# Patient Record
Sex: Male | Born: 1955 | Race: Black or African American | Hispanic: No | Smoking: Never smoker
Health system: Southern US, Community
[De-identification: ages and names within clinical notes are randomized; demographics above are authoritative.]

## PROBLEM LIST (undated history)

## (undated) DIAGNOSIS — I441 Atrioventricular block, second degree: Secondary | ICD-10-CM

## (undated) DIAGNOSIS — R9431 Abnormal electrocardiogram [ECG] [EKG]: Secondary | ICD-10-CM

## (undated) DIAGNOSIS — I351 Nonrheumatic aortic (valve) insufficiency: Secondary | ICD-10-CM

## (undated) DIAGNOSIS — R0609 Other forms of dyspnea: Secondary | ICD-10-CM

## (undated) DIAGNOSIS — M109 Gout, unspecified: Secondary | ICD-10-CM

## (undated) DIAGNOSIS — E785 Hyperlipidemia, unspecified: Secondary | ICD-10-CM

## (undated) DIAGNOSIS — I2510 Atherosclerotic heart disease of native coronary artery without angina pectoris: Secondary | ICD-10-CM

## (undated) DIAGNOSIS — I10 Essential (primary) hypertension: Secondary | ICD-10-CM

## (undated) DIAGNOSIS — R0600 Dyspnea, unspecified: Secondary | ICD-10-CM

## (undated) HISTORY — DX: Abnormal electrocardiogram (ECG) (EKG): R94.31

## (undated) HISTORY — DX: Nonrheumatic aortic (valve) insufficiency: I35.1

## (undated) HISTORY — DX: Atherosclerotic heart disease of native coronary artery without angina pectoris: I25.10

## (undated) HISTORY — DX: Hyperlipidemia, unspecified: E78.5

## (undated) HISTORY — DX: Dyspnea, unspecified: R06.00

## (undated) HISTORY — DX: Other forms of dyspnea: R06.09

## (undated) HISTORY — DX: Atrioventricular block, second degree: I44.1

## (undated) HISTORY — PX: TUMOR REMOVAL: SHX12

---

## 2009-02-21 DIAGNOSIS — H409 Unspecified glaucoma: Secondary | ICD-10-CM | POA: Insufficient documentation

## 2009-05-22 DIAGNOSIS — M109 Gout, unspecified: Secondary | ICD-10-CM | POA: Insufficient documentation

## 2009-06-06 ENCOUNTER — Ambulatory Visit: Payer: Self-pay | Admitting: Gastroenterology

## 2009-06-20 ENCOUNTER — Ambulatory Visit: Payer: Self-pay | Admitting: Gastroenterology

## 2009-06-22 ENCOUNTER — Encounter: Payer: Self-pay | Admitting: Gastroenterology

## 2009-06-28 DIAGNOSIS — I10 Essential (primary) hypertension: Secondary | ICD-10-CM | POA: Insufficient documentation

## 2009-06-28 DIAGNOSIS — E785 Hyperlipidemia, unspecified: Secondary | ICD-10-CM | POA: Insufficient documentation

## 2009-06-28 DIAGNOSIS — E119 Type 2 diabetes mellitus without complications: Secondary | ICD-10-CM | POA: Insufficient documentation

## 2011-02-02 LAB — GLUCOSE, CAPILLARY: Glucose-Capillary: 104 mg/dL — ABNORMAL HIGH (ref 70–99)

## 2011-02-21 ENCOUNTER — Other Ambulatory Visit: Payer: Self-pay | Admitting: Otolaryngology

## 2011-02-21 DIAGNOSIS — J3489 Other specified disorders of nose and nasal sinuses: Secondary | ICD-10-CM

## 2011-02-22 ENCOUNTER — Other Ambulatory Visit: Payer: Self-pay | Admitting: Otolaryngology

## 2011-02-22 ENCOUNTER — Ambulatory Visit
Admission: RE | Admit: 2011-02-22 | Discharge: 2011-02-22 | Disposition: A | Discharge status: Home / Self Care | Payer: PRIVATE HEALTH INSURANCE | Source: Ambulatory Visit | Attending: Otolaryngology | Admitting: Otolaryngology

## 2011-02-22 DIAGNOSIS — J3489 Other specified disorders of nose and nasal sinuses: Secondary | ICD-10-CM

## 2012-05-06 IMAGING — CT CT MAXILLOFACIAL W/O CM
3 series · 15 of 47 positions shown, 18 images · non-contrast
Comparison: None.

CLINICAL DATA: 54-year-old male with left nasal mass, polyp,
headache, preoperative study.

CT MAXILLOFACIAL WITHOUT CONTRAST
TECHNIQUE: Multidetector CT imaging of the maxillofacial
structures was performed. Multiplanar CT image reconstructions were
also generated.

[Series 2: fusion/stealth · axial · 0.45mm/px · z∈[-42,+129]mm · 9 of 159 slices shown, 12 images]
[im 11/159  brain]
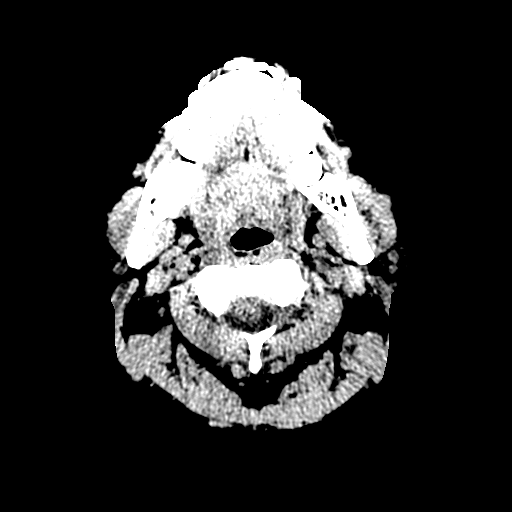
[im 11/159  bone]
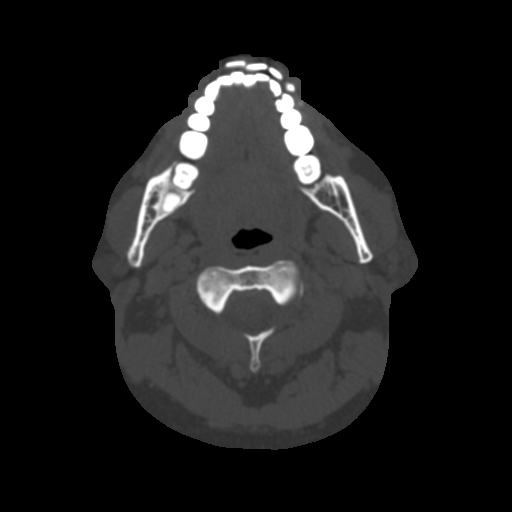
[im 28/159  bone]
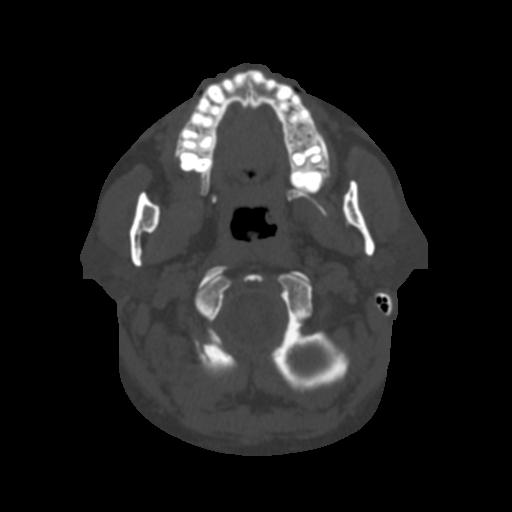
[im 44/159  bone]
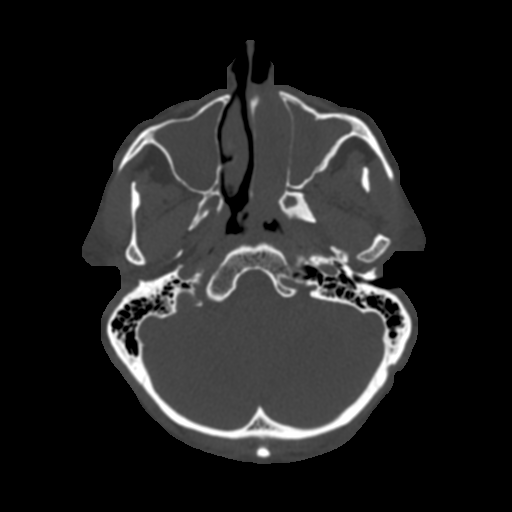
[im 60/159  bone]
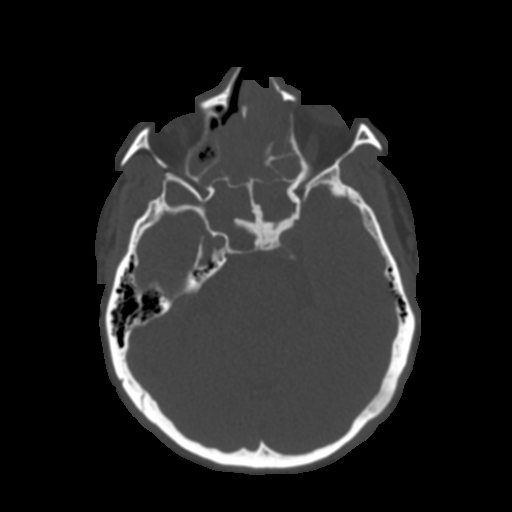
[im 82/159  brain]
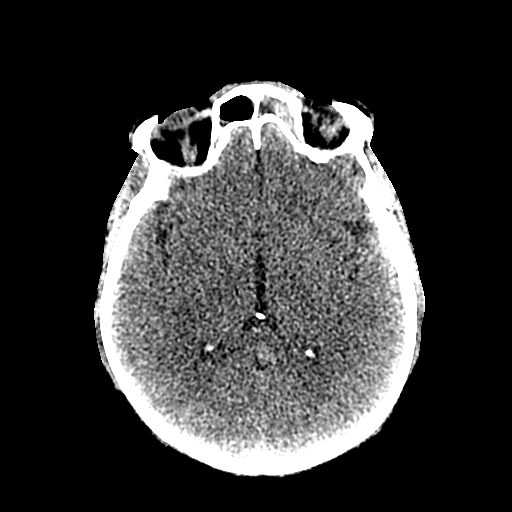
[im 82/159  bone]
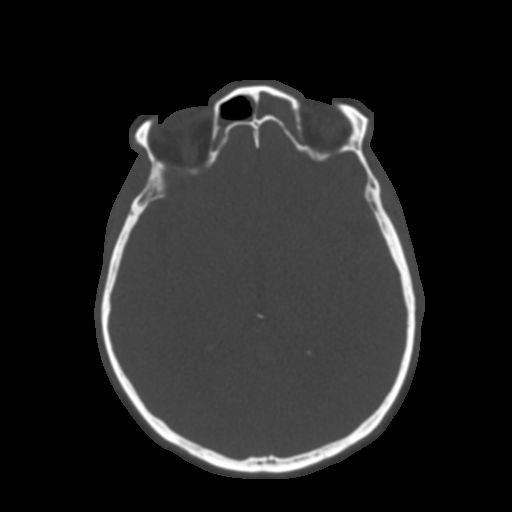
[im 99/159  bone]
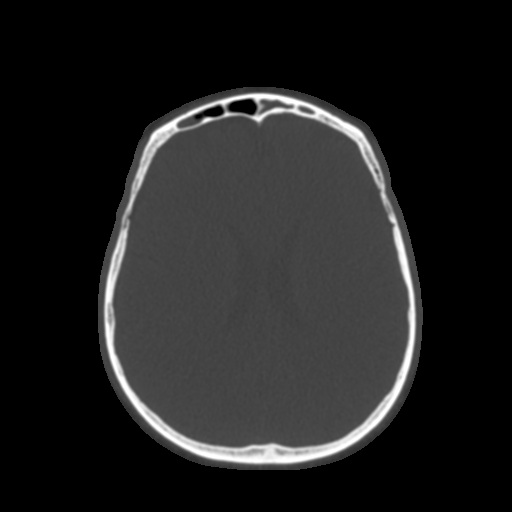
[im 115/159  bone]
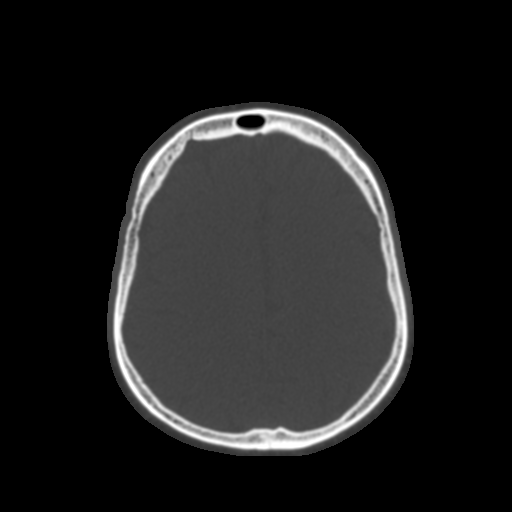
[im 131/159  bone]
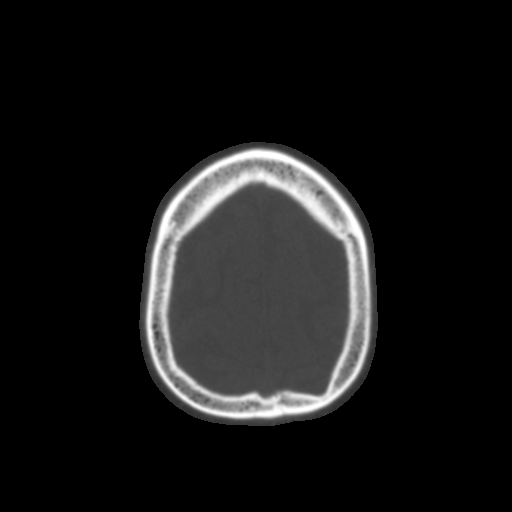
[im 148/159  brain]
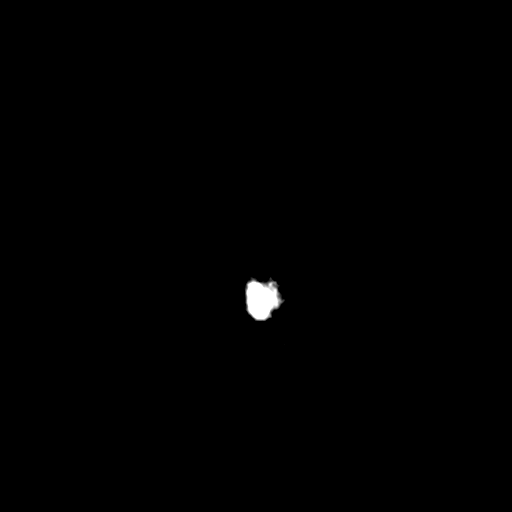
[im 148/159  bone]
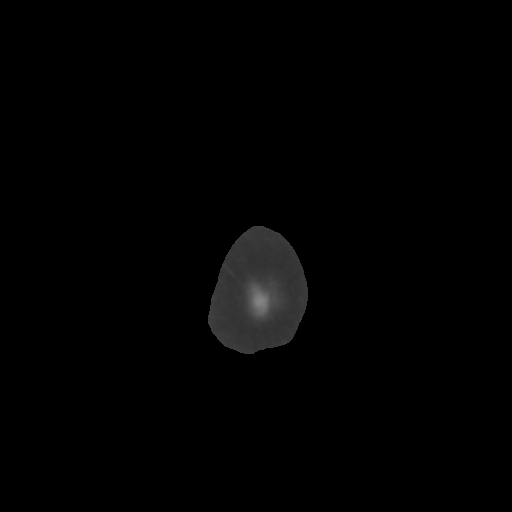

[Series 300: sagittal · sagittal · 0.39mm/px · 3 of 82 slices shown]
[im 28/82  bone]
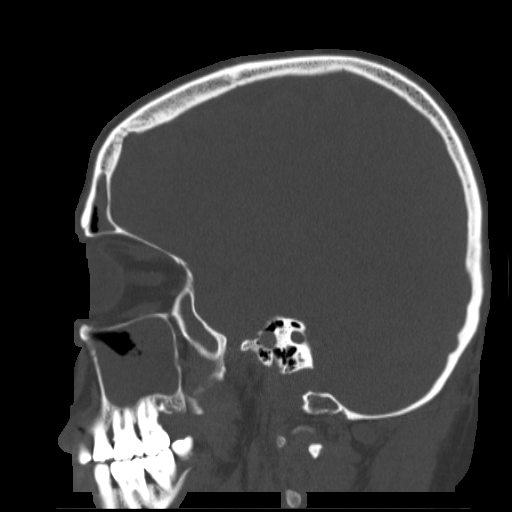
[im 41/82  bone]
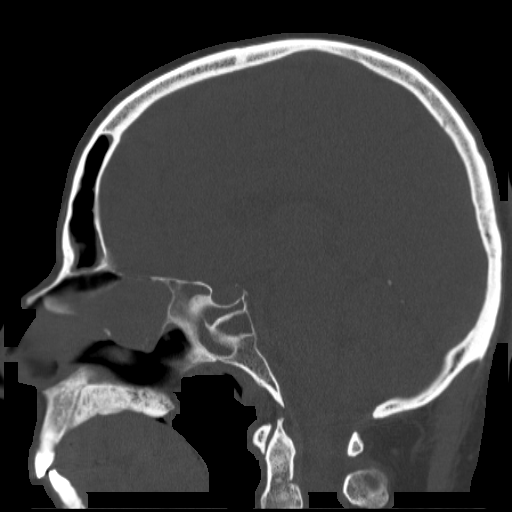
[im 55/82  bone]
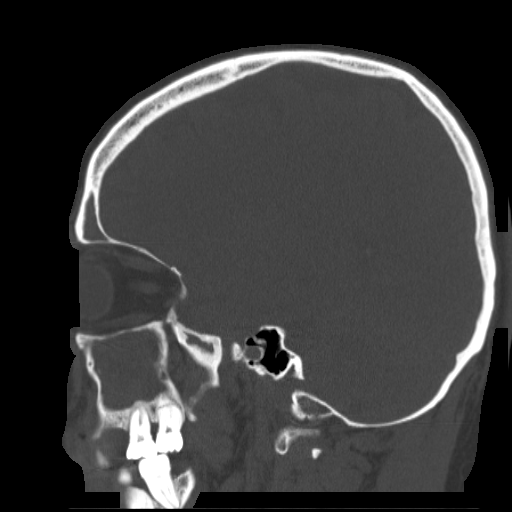

[Series 301: coronal · coronal · 0.39mm/px · 3 of 109 slices shown]
[im 37/109  bone]
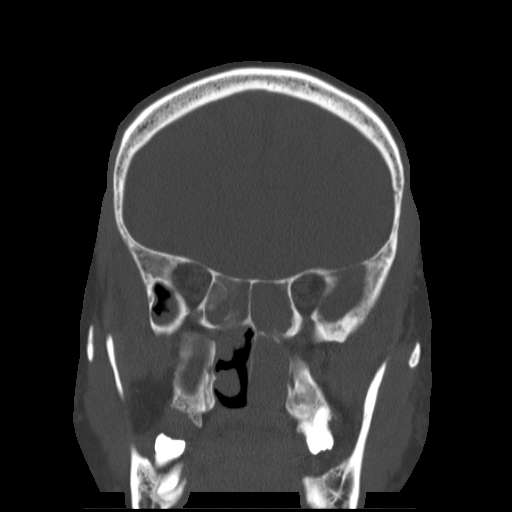
[im 49/109  bone]
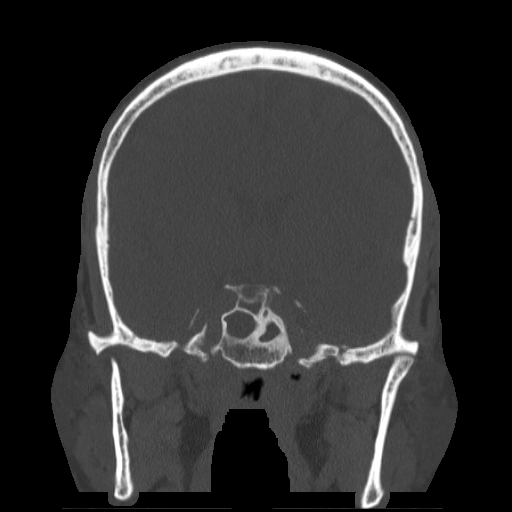
[im 61/109  bone]
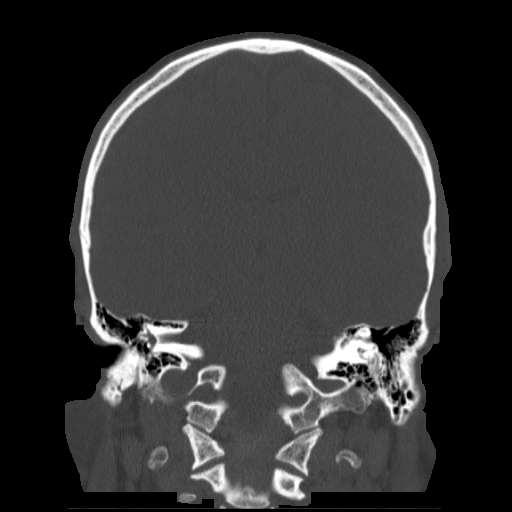

[15 of 47 positions shown; findings below may reference images not displayed]

FINDINGS: Visualized noncontrast brain parenchyma is within normal
limits for age. Calcified atherosclerosis at the skull base.
Visualized orbit soft tissues are within normal limits.  Visualized
scalp soft tissues are within normal limits.  Visualized deep soft
tissue spaces of the face are within normal limits.

The left nasal cavity, and the bilateral ethmoid air cells are
expanded and appear consolidated.  The left maxillary and frontal
sinuses are completely opacified.  Both sphenoid sinuses are
completely opacified.  There is sub total opacification of the
right maxillary sinus with some bubbly opacity.  The abnormal
density within the expanded sinuses is intermediate (40 HU).  The
maxillary and sphenoid sinuses show periosteal thickening
diffusely.    Thinning of sinus bone elsewhere.  No overt bony
destruction.  No intraorbital or subfrontal extension based on
these images.  There is a globular opacity extending into and
largely effacing the nasopharynx (series 2 image 40, sagittal image
44).  This is inseparable in some places from the adenoids.  The
left fossa of Rosenmuller is normal.

The soft and hard palate appear within normal limits. The tympanic
cavities, mastoids, and pneumatized petrous apices are clear.
IMPRESSION: 1.  Expanded and opacified paranasal sinuses - predominately the
ethmoid and left nasal cavity.  Right maxillary sinus only
partially opacified - favor obstructed secretions here.  MRI with
contrast would best differentiate soft tissue mass versus
obstructed secretions in the other sinuses.
2.  Thinning of paranasal bones, and maxillary and sphenoid chronic
periosteal thickening, without bony destruction.
3.  Lobulated soft tissue and/or secretions extending into and
largely effacing the nasopharynx. No definite intracranial
extension.

## 2012-05-11 ENCOUNTER — Encounter (HOSPITAL_COMMUNITY): Payer: Self-pay | Admitting: *Deleted

## 2012-05-11 ENCOUNTER — Emergency Department (INDEPENDENT_AMBULATORY_CARE_PROVIDER_SITE_OTHER)
Admission: EM | Admit: 2012-05-11 | Discharge: 2012-05-11 | Disposition: A | Discharge status: Home / Self Care | Payer: PRIVATE HEALTH INSURANCE | Source: Home / Self Care | Attending: Emergency Medicine | Admitting: Emergency Medicine

## 2012-05-11 DIAGNOSIS — L0390 Cellulitis, unspecified: Secondary | ICD-10-CM

## 2012-05-11 DIAGNOSIS — L0291 Cutaneous abscess, unspecified: Secondary | ICD-10-CM

## 2012-05-11 HISTORY — DX: Gout, unspecified: M10.9

## 2012-05-11 HISTORY — DX: Essential (primary) hypertension: I10

## 2012-05-11 MED ORDER — MUPIROCIN 2 % EX OINT: TOPICAL_OINTMENT | Freq: Three times a day (TID) | CUTANEOUS | Status: AC

## 2012-05-11 NOTE — ED Provider Notes (Addendum)
Chief Complaint  Patient presents with  . Recurrent Skin Infections    History of Present Illness:    Terrence Pena is a 56 year old male who presents today with a one-week history of an abscess on his right forearm and 2 on his abdomen. They have not drained any pus. He denies any fever or chills. He has no prior history of abscesses or MRSA and no exposure to anyone who has had any type of skin infection. He has already been seen by his primary care physician and prescribed doxycycline and hydrocodone. His primary care physician referred him here for incision and drainage.  Review of Systems:  Other than noted above, the patient denies any of the following symptoms: Systemic:  No fever, chills or sweats. Skin:  No rash or itching.  PMFSH:  Past medical history, family history, social history, meds, and allergies were reviewed.  No history of diabetes or prior history of abscesses or MRSA.  Physical Exam:   Vital signs:  BP 143/94  Pulse 103  Temp 98.2 F (36.8 C) (Oral)  Resp 18  SpO2 99% Skin:  He has a tiny abscess on his right forearm that is mostly dried up completely. He has 2 abscesses on his abdomen. The lower one is larger. It is very inflamed and red and measures 4 cm in diameter. The smaller of the 2 which was superior measures 2 cm in diameter. Both of these feel fluctuant.  Skin exam was otherwise normal.  No rash. Ext:  Distal pulses were full, patient has full ROM of all joints.   2 Abscesses on abdomen.   1 abscess on right forearm.  Procedure:  Verbal informed consent was obtained.  The patient was informed of the risks and benefits of the procedure and understands and accepts.  Identity of the patient was verified verbally and by wristband.   The abscess areas on the abdomen described above were prepped with Betadine and alcohol and anesthetized with 10 mL of 2% Xylocaine with epinephrine.  Using a #11 scalpel blade, a singe straight incision was made into the area of  fluctulence, yielding a large amount of prurulent drainage the larger of the 2 abscesses and no drainage from the smaller.  Routine cultures were obtained.  Blunt dissection was used to break up loculations and the resulting wound cavity was packed with 1/4 inch Iodoform gauze.  A sterile pressure dressing was applied.  Assessment:  The encounter diagnosis was Abscess.  Plan:   1.  The following meds were prescribed:   New Prescriptions   MUPIROCIN OINTMENT (BACTROBAN) 2 %    Apply topically 3 (three) times daily.   2.  The patient was instructed in symptomatic care and handouts were given. 3.  The patient was instructed to leave the dressing in place and return again in 48 hours for packing removal. He was instructed to take the doxycycline and hydrocodone as prescribed by his primary care physician. I suggested he apply the mupirocin ointment to his nostrils 3 times a day for a month for prevention of future infections, since this is probably MRSA.   Reuben Likes, MD 05/11/12 2105  Reuben Likes, MD 05/11/12 2115

## 2012-05-11 NOTE — ED Notes (Signed)
Pt  Reports  Red  Swollen tender  Area  To  abd  Which     He  Noticed         A  Few  Days  Ago         No  History  Of  Any  Similar  Episodes      Alert  And  Oriented  And  Is  In no  Acute  Distress

## 2012-05-14 ENCOUNTER — Telehealth (HOSPITAL_COMMUNITY): Payer: Self-pay | Admitting: *Deleted

## 2012-05-14 LAB — CULTURE, ROUTINE-ABSCESS
Gram Stain: NONE SEEN
Special Requests: NORMAL

## 2012-05-14 NOTE — ED Notes (Signed)
Abscess culture: Mod. MRSA.  Pt. adequately treated with Doxycycline by his PCP and Bactroban 2% oint.  I called and left a message for pt. to call. Vassie Moselle 05/14/2012

## 2012-05-15 ENCOUNTER — Telehealth (HOSPITAL_COMMUNITY): Payer: Self-pay | Admitting: *Deleted

## 2012-05-15 NOTE — ED Notes (Signed)
Pt. called back.  Pt. verified x 2 and given result.  Pt. told he was adequately treated with Doxycycline. Pt. given MRSA instructions and voiced understanding. Vassie Moselle 05/15/2012

## 2014-04-05 ENCOUNTER — Encounter: Payer: Self-pay | Admitting: Gastroenterology

## 2016-02-19 ENCOUNTER — Encounter: Payer: Self-pay | Admitting: Gastroenterology

## 2016-02-25 ENCOUNTER — Ambulatory Visit (INDEPENDENT_AMBULATORY_CARE_PROVIDER_SITE_OTHER): Payer: 59 | Admitting: Family Medicine

## 2016-02-25 VITALS — BP 142/86 | HR 67 | Temp 97.5°F | Resp 18 | Ht 72.0 in | Wt 197.0 lb

## 2016-02-25 DIAGNOSIS — M10022 Idiopathic gout, left elbow: Secondary | ICD-10-CM | POA: Diagnosis not present

## 2016-02-25 DIAGNOSIS — M109 Gout, unspecified: Secondary | ICD-10-CM

## 2016-02-25 DIAGNOSIS — M25522 Pain in left elbow: Secondary | ICD-10-CM | POA: Diagnosis not present

## 2016-02-25 DIAGNOSIS — E119 Type 2 diabetes mellitus without complications: Secondary | ICD-10-CM

## 2016-02-25 LAB — GLUCOSE, POCT (MANUAL RESULT ENTRY): POC Glucose: 128 mg/dl — AB (ref 70–99)

## 2016-02-25 MED ORDER — PREDNISONE 20 MG PO TABS: 40.0000 mg | ORAL_TABLET | Freq: Every day | ORAL | Status: DC

## 2016-02-25 MED ORDER — HYDROCODONE-ACETAMINOPHEN 5-325 MG PO TABS: 1.0000 | ORAL_TABLET | Freq: Four times a day (QID) | ORAL | Status: DC | PRN

## 2016-02-25 NOTE — Patient Instructions (Addendum)
IF you received an x-ray today, you will receive an invoice from [facility]. Please contact [facility] at [phone] with questions or concerns regarding your invoice.   IF you received labwork today, you will receive an invoice from [employer]. Please contact [facility] at [phone] with questions or concerns regarding your invoice.   Our billing staff will not be able to assist you with questions regarding bills from these companies.  You will be contacted with the lab results as soon as they are available. The fastest way to get your results is to activate your My Chart account. Instructions are located on the last page of this paperwork. If you have not heard from [place] regarding the results in 2 weeks, please contact this office.    Gout flare was likely due to food from last week. However follow-up with primary care provider to decide if uric acid testing or change in gout treatment is needed.   For current flare, based on timing of your symptoms, prednisone was prescribed. Start 2 pills per day for 5 days, check your blood sugars frequently when you're on this medicine as it will likely increase blood sugars. If they remain over 200, discuss with your primary care provider any needed short-term medication changes while you're taking the prednisone. Hydrocodone if needed short-term for elbow pain, but prednisone should help fairly quickly.  If any increased redness, swelling, fevers chills, or worsening elbow pain, return right away to make sure there is not a sign of infection.   Gout Gout is an inflammatory arthritis caused by a buildup of uric acid crystals in the joints. Uric acid is a chemical that is normally present in the blood. When the level of uric acid in the blood is too high it can form crystals that deposit in your joints and tissues. This causes joint redness, soreness, and swelling (inflammation). Repeat attacks are common.  Over time, uric acid crystals can form into masses (tophi) near a joint, destroying bone and causing disfigurement. Gout is treatable and often preventable. CAUSES  The disease begins with elevated levels of uric acid in the blood. Uric acid is produced by your body when it breaks down a naturally found substance called purines. Certain foods you eat, such as meats and fish, contain high amounts of purines. Causes of an elevated uric acid level include:  Being passed down from parent to child (heredity).  Diseases that cause increased uric acid production (such as obesity, psoriasis, and certain cancers).  Excessive alcohol use.  Diet, especially diets rich in meat and seafood.  Medicines, including certain cancer-fighting medicines (chemotherapy), water pills (diuretics), and aspirin.  Chronic kidney disease. The kidneys are no longer able to remove uric acid well.  Problems with metabolism. Conditions strongly associated with gout include:  Obesity.  High blood pressure.  High cholesterol.  Diabetes. Not everyone with elevated uric acid levels gets gout. It is not understood why some people get gout and others do not. Surgery, joint injury, and eating too much of certain foods are some of the factors that can lead to gout attacks. SYMPTOMS   An attack of gout comes on quickly. It causes intense pain with redness, swelling, and warmth in a joint.  Fever can occur.  Often, only one joint is involved. Certain joints are more commonly involved:  Base of the big toe.  Knee.  Ankle.  Wrist.  Finger. Without treatment, an attack usually goes away in a few days  to weeks. Between attacks, you usually will not have symptoms, which is different from many other forms of arthritis. DIAGNOSIS  Your caregiver will suspect gout based on your symptoms and exam. In some cases, tests may be recommended. The tests may include:  Blood tests.  Urine tests.  X-rays.  Joint fluid  exam. This exam requires a needle to remove fluid from the joint (arthrocentesis). Using a microscope, gout is confirmed when uric acid crystals are seen in the joint fluid. TREATMENT  There are two phases to gout treatment: treating the sudden onset (acute) attack and preventing attacks (prophylaxis).  Treatment of an Acute Attack.  Medicines are used. These include anti-inflammatory medicines or steroid medicines.  An injection of steroid medicine into the affected joint is sometimes necessary.  The painful joint is rested. Movement can worsen the arthritis.  You may use warm or cold treatments on painful joints, depending which works best for you.  Treatment to Prevent Attacks.  If you suffer from frequent gout attacks, your caregiver may advise preventive medicine. These medicines are started after the acute attack subsides. These medicines either help your kidneys eliminate uric acid from your body or decrease your uric acid production. You may need to stay on these medicines for a very long time.  The early phase of treatment with preventive medicine can be associated with an increase in acute gout attacks. For this reason, during the first few months of treatment, your caregiver may also advise you to take medicines usually used for acute gout treatment. Be sure you understand your caregiver's directions. Your caregiver may make several adjustments to your medicine dose before these medicines are effective.  Discuss dietary treatment with your caregiver or dietitian. Alcohol and drinks high in sugar and fructose and foods such as meat, poultry, and seafood can increase uric acid levels. Your caregiver or dietitian can advise you on drinks and foods that should be limited. HOME CARE INSTRUCTIONS   Do not take aspirin to relieve pain. This raises uric acid levels.  Only take over-the-counter or prescription medicines for pain, discomfort, or fever as directed by your caregiver.  Rest  the joint as much as possible. When in bed, keep sheets and blankets off painful areas.  Keep the affected joint raised (elevated).  Apply warm or cold treatments to painful joints. Use of warm or cold treatments depends on which works best for you.  Use crutches if the painful joint is in your leg.  Drink enough fluids to keep your urine clear or pale yellow. This helps your body get rid of uric acid. Limit alcohol, sugary drinks, and fructose drinks.  Follow your dietary instructions. Pay careful attention to the amount of protein you eat. Your daily diet should emphasize fruits, vegetables, whole grains, and fat-free or low-fat milk products. Discuss the use of coffee, vitamin C, and cherries with your caregiver or dietitian. These may be helpful in lowering uric acid levels.  Maintain a healthy body weight. SEEK MEDICAL CARE IF:   You develop diarrhea, vomiting, or any side effects from medicines.  You do not feel better in 24 hours, or you are getting worse. SEEK IMMEDIATE MEDICAL CARE IF:   Your joint becomes suddenly more tender, and you have chills or a fever. MAKE SURE YOU:   Understand these instructions.  Will watch your condition.  Will get help right away if you are not doing well or get worse.   This information is not intended to replace advice given  to you by your health care provider. Make sure you discuss any questions you have with your health care provider.   Document Released: 10/11/2000 Document Revised: 11/04/2014 Document Reviewed: 05/27/2012 [employer] Interactive Patient Education [employer]

## 2016-02-25 NOTE — Progress Notes (Signed)
Subjective:  By signing my name below, I, Stann Ore, attest that this documentation has been prepared under the direction and in the presence of Terrence Staggers, MD. Electronically Signed: Stann Ore, Scribe. 02/25/2016 , 9:20 AM .  Patient was seen in Room 10 .   Patient ID: Terrence Pena., male    DOB: 1956/04/11, 60 y.o.   MRN: [id number] Chief Complaint  Patient presents with  . Gout    Left arm, elbow/wrist   HPI Terrence Pena. is a 60 y.o. male H/o gout, DM, HTN, and HLD.   He believes he has gout flare up in his left wrist and left elbow that is very tender to touch started a week ago. He usually is on good diet, but had a barbeque cookout last week. He states it worsened yesterday, feeling heaviness and soreness in his left arm. He is usually followed by Dr. Concepcion Elk. He's taken 1 tablet colchicine qd when the flare started up. He's also wearing a neoprene elbow sleeve. He denies any known injuries to the area. He denies neck pain, shoulder pain, or weakness.   His BP has been running high due to pain. He reports his sugar has been running well.   He drives a truck for work.   Patient Active Problem List   Diagnosis Date Noted  . HLD (hyperlipidemia) 06/28/2009  . Diabetes mellitus (HCC) 06/28/2009  . Essential (primary) hypertension 06/28/2009  . Arthritis due to gout 05/22/2009  . Glaucoma 02/21/2009   Past Medical History  Diagnosis Date  . Hypertension   . Diabetes mellitus   . Gout    History reviewed. No pertinent past surgical history. No Known Allergies Prior to Admission medications   Medication Sig Start Date End Date Taking? Authorizing Provider  allopurinol (ZYLOPRIM) 300 MG tablet Take 300 mg by mouth daily.    Historical Provider, MD  atorvastatin (LIPITOR) 20 MG tablet Take 20 mg by mouth daily.    Historical Provider, MD  bimatoprost (LUMIGAN) 0.03 % ophthalmic solution 1 drop at bedtime.    Historical Provider, MD  colchicine  0.6 MG tablet Take 0.6 mg by mouth daily.    Historical Provider, MD  glimepiride (AMARYL) 1 MG tablet Take 1 mg by mouth daily before breakfast.    Historical Provider, MD  olmesartan (BENICAR) 40 MG tablet Take 40 mg by mouth daily.    Historical Provider, MD   Social History   Social History  . Marital Status: Married    Spouse Name: N/A  . Number of Children: N/A  . Years of Education: N/A   Occupational History  . Not on file.   Social History Main Topics  . Smoking status: Never Smoker   . Smokeless tobacco: Not on file  . Alcohol Use: Yes  . Drug Use: Not on file  . Sexual Activity: Not on file   Other Topics Concern  . Not on file   Social History Narrative   Review of Systems  Constitutional: Negative for fever, chills and fatigue.  Musculoskeletal: Positive for myalgias, joint swelling and arthralgias. Negative for back pain, neck pain and neck stiffness.  Skin: Negative for rash and wound.  Neurological: Negative for dizziness and weakness.       Objective:   Physical Exam  Constitutional: He is oriented to person, place, and time. He appears well-developed and well-nourished. No distress.  HENT:  Head: Normocephalic and atraumatic.  Eyes: EOM are normal. Pupils are equal, round, and  reactive to light.  Neck: Neck supple.  Cardiovascular: Normal rate.   Pulmonary/Chest: Effort normal. No respiratory distress.  Musculoskeletal: Normal range of motion.  Left elbow: tender along lateral epicondyle, olecranon, and medial epicondyle, skin intact, slight warmth but very faint erythema over lateral elbow; no wounds, guarded rom approximately 30 degrees total in elbow Left wrist: forearm non tender, tenderness over ulnar styloid with minimal associated warmth, good grip strength on the right; cap refill <1, NVI distally Neck: pain-free rom  Neurological: He is alert and oriented to person, place, and time.  Skin: Skin is warm and dry.  Psychiatric: He has a normal  mood and affect. His behavior is normal.  Nursing note and vitals reviewed.   Filed Vitals:   02/25/16 0855  BP: 162/88  Pulse: 67  Temp: 97.5 F (36.4 C)  TempSrc: Oral  Resp: 18  Height: 6' (1.829 m)  Weight: 197 lb (89.359 kg)  SpO2: 97%   Results for orders placed or performed in visit on 02/25/16  POCT glucose (manual entry)  Result Value Ref Range   POC Glucose 128 (A) 70 - 99 mg/dl       Assessment & Plan:   Terrence Pena. is a 60 y.o. male Type 2 diabetes mellitus without complication, without long-term current use of insulin (HCC) - Plan: POCT glucose (manual entry)  Pain in left elbow - Plan: predniSONE (DELTASONE) 20 MG tablet, HYDROcodone-acetaminophen (NORCO/VICODIN) 5-325 MG tablet  Acute gout of left elbow, unspecified cause - Plan: predniSONE (DELTASONE) 20 MG tablet  Suspected gout flare, now one week into symptoms with left elbow and left wrist. No known injury, no wound or apparent infection.   -Based on timing of symptoms and lack of relief with colchicine, will start prednisone, 40 mg daily for 5 days. Side effects discussed, and advised to monitor glucose frequently.  -Can discuss if other med changes needed with his PCP regarding gout tx, but likely food related with this current flare. Last episode one year ago, so may not need daily med.   - RTC precautions if any fevers, increased swelling, redness, or pain of his elbow.   Meds ordered this encounter  Medications  . amLODipine (NORVASC) 10 MG tablet    Sig: Take 10 mg by mouth daily.  . Saxagliptin-Metformin (KOMBIGLYZE XR) 2.02-999 MG TB24    Sig: Take by mouth.  . olmesartan-hydrochlorothiazide (BENICAR HCT) 40-25 MG tablet    Sig: Take 1 tablet by mouth daily.  . predniSONE (DELTASONE) 20 MG tablet    Sig: Take 2 tablets (40 mg total) by mouth daily with breakfast.    Dispense:  10 tablet    Refill:  0  . HYDROcodone-acetaminophen (NORCO/VICODIN) 5-325 MG tablet    Sig: Take 1  tablet by mouth every 6 (six) hours as needed for moderate pain.    Dispense:  15 tablet    Refill:  0   Patient Instructions       IF you received an x-ray today, you will receive an invoice from [facility]. Please contact [facility] at [phone] with questions or concerns regarding your invoice.   IF you received labwork today, you will receive an invoice from [employer]. Please contact [facility] at [phone] with questions or concerns regarding your invoice.   Our billing staff will not be able to assist you with questions regarding bills from these companies.  You will be contacted with the lab results as soon as they are  available. The fastest way to get your results is to activate your My Chart account. Instructions are located on the last page of this paperwork. If you have not heard from us regarding the results in 2 weeks, please contact this office.    Gout flare was likely due to food from last week. However follow-up with primary care provider to decide if uric acid testing or change in gout treatment is needed.   For current flare, based on timing of your symptoms, prednisone was prescribed. Start 2 pills per day for 5 days, check your blood sugars frequently when you're on this medicine as it will likely increase blood sugars. If they remain over 200, discuss with your primary care provider any needed short-term medication changes while you're taking the prednisone. Hydrocodone if needed short-term for elbow pain, but prednisone should help fairly quickly.  If any increased redness, swelling, fevers chills, or worsening elbow pain, return right away to make sure there is not a sign of infection.   Gout Gout is an inflammatory arthritis caused by a buildup of uric acid crystals in the joints. Uric acid is a chemical that is normally present in the blood. When the level of uric acid in the blood is too high it can form crystals  that deposit in your joints and tissues. This causes joint redness, soreness, and swelling (inflammation). Repeat attacks are common. Over time, uric acid crystals can form into masses (tophi) near a joint, destroying bone and causing disfigurement. Gout is treatable and often preventable. CAUSES  The disease begins with elevated levels of uric acid in the blood. Uric acid is produced by your body when it breaks down a naturally found substance called purines. Certain foods you eat, such as meats and fish, contain high amounts of purines. Causes of an elevated uric acid level include:  Being passed down from parent to child (heredity).  Diseases that cause increased uric acid production (such as obesity, psoriasis, and certain cancers).  Excessive alcohol use.  Diet, especially diets rich in meat and seafood.  Medicines, including certain cancer-fighting medicines (chemotherapy), water pills (diuretics), and aspirin.  Chronic kidney disease. The kidneys are no longer able to remove uric acid well.  Problems with metabolism. Conditions strongly associated with gout include:  Obesity.  High blood pressure.  High cholesterol.  Diabetes. Not everyone with elevated uric acid levels gets gout. It is not understood why some people get gout and others do not. Surgery, joint injury, and eating too much of certain foods are some of the factors that can lead to gout attacks. SYMPTOMS   An attack of gout comes on quickly. It causes intense pain with redness, swelling, and warmth in a joint.  Fever can occur.  Often, only one joint is involved. Certain joints are more commonly involved:  Base of the big toe.  Knee.  Ankle.  Wrist.  Finger. Without treatment, an attack usually goes away in a few days to weeks. Between attacks, you usually will not have symptoms, which is different from many other forms of arthritis. DIAGNOSIS  Your caregiver will suspect gout based on your symptoms  and exam. In some cases, tests may be recommended. The tests may include:  Blood tests.  Urine tests.  X-rays.  Joint fluid exam. This exam requires a needle to remove fluid from the joint (arthrocentesis). Using a microscope, gout is confirmed when uric acid crystals are seen in the joint fluid. TREATMENT  There are two phases to gout  treatment: treating the sudden onset (acute) attack and preventing attacks (prophylaxis).  Treatment of an Acute Attack.  Medicines are used. These include anti-inflammatory medicines or steroid medicines.  An injection of steroid medicine into the affected joint is sometimes necessary.  The painful joint is rested. Movement can worsen the arthritis.  You may use warm or cold treatments on painful joints, depending which works best for you.  Treatment to Prevent Attacks.  If you suffer from frequent gout attacks, your caregiver may advise preventive medicine. These medicines are started after the acute attack subsides. These medicines either help your kidneys eliminate uric acid from your body or decrease your uric acid production. You may need to stay on these medicines for a very long time.  The early phase of treatment with preventive medicine can be associated with an increase in acute gout attacks. For this reason, during the first few months of treatment, your caregiver may also advise you to take medicines usually used for acute gout treatment. Be sure you understand your caregiver's directions. Your caregiver may make several adjustments to your medicine dose before these medicines are effective.  Discuss dietary treatment with your caregiver or dietitian. Alcohol and drinks high in sugar and fructose and foods such as meat, poultry, and seafood can increase uric acid levels. Your caregiver or dietitian can advise you on drinks and foods that should be limited. HOME CARE INSTRUCTIONS   Do not take aspirin to relieve pain. This raises uric acid  levels.  Only take over-the-counter or prescription medicines for pain, discomfort, or fever as directed by your caregiver.  Rest the joint as much as possible. When in bed, keep sheets and blankets off painful areas.  Keep the affected joint raised (elevated).  Apply warm or cold treatments to painful joints. Use of warm or cold treatments depends on which works best for you.  Use crutches if the painful joint is in your leg.  Drink enough fluids to keep your urine clear or pale yellow. This helps your body get rid of uric acid. Limit alcohol, sugary drinks, and fructose drinks.  Follow your dietary instructions. Pay careful attention to the amount of protein you eat. Your daily diet should emphasize fruits, vegetables, whole grains, and fat-free or low-fat milk products. Discuss the use of coffee, vitamin C, and cherries with your caregiver or dietitian. These may be helpful in lowering uric acid levels.  Maintain a healthy body weight. SEEK MEDICAL CARE IF:   You develop diarrhea, vomiting, or any side effects from medicines.  You do not feel better in 24 hours, or you are getting worse. SEEK IMMEDIATE MEDICAL CARE IF:   Your joint becomes suddenly more tender, and you have chills or a fever. MAKE SURE YOU:   Understand these instructions.  Will watch your condition.  Will get help right away if you are not doing well or get worse.   This information is not intended to replace advice given to you by your health care provider. Make sure you discuss any questions you have with your health care provider.   Document Released: 10/11/2000 Document Revised: 11/04/2014 Document Reviewed: 05/27/2012 Elsevier Interactive Patient Education [employer]     I personally performed the services described in this documentation, which was scribed in my presence. The recorded information has been reviewed and considered, and addended by me as needed.

## 2018-12-09 ENCOUNTER — Ambulatory Visit: Payer: BLUE CROSS/BLUE SHIELD | Admitting: Cardiology

## 2018-12-09 ENCOUNTER — Encounter: Payer: Self-pay | Admitting: Cardiology

## 2018-12-09 VITALS — BP 149/70 | HR 53 | Ht 73.0 in | Wt 218.0 lb

## 2018-12-09 DIAGNOSIS — I351 Nonrheumatic aortic (valve) insufficiency: Secondary | ICD-10-CM | POA: Diagnosis not present

## 2018-12-09 DIAGNOSIS — N183 Chronic kidney disease, stage 3 unspecified: Secondary | ICD-10-CM

## 2018-12-09 DIAGNOSIS — R600 Localized edema: Secondary | ICD-10-CM

## 2018-12-09 DIAGNOSIS — I159 Secondary hypertension, unspecified: Secondary | ICD-10-CM | POA: Diagnosis not present

## 2018-12-09 MED ORDER — FUROSEMIDE 40 MG PO TABS: 40.0000 mg | ORAL_TABLET | Freq: Every day | ORAL | 2 refills | Status: DC

## 2018-12-09 NOTE — Progress Notes (Signed)
Patient is here for follow up visit.  Subjective:   @Patient  ID: Terrence RosenthalFrederick D Kil Jr., male    DOB: [birth year], 63 y.o.   MRN: [id number]  Chief Complaint  Patient presents with  . Hypertension    3 month f/u aortic regurgitation   . Coronary Artery Disease    pt not taking bidil since last week, cannot affford it    HPI   63 year-old African-American male with hypertension, uncontrolled type II diabetes mellitus, chronic kidney disease, hyperlipidemia, glaucoma, Mobitz type 1 AV block, mild to moderate aortic regurgitation, mildly reduced LVEF (05/2018), stress test with no significant ischemia/infarction (05/2018).  I suspect his dyspnea primarily to be due to uncontrolled hypertension. At last visit in 08/2018, I started him on Bidil 20-37.5 mg bid. Mobitz type 1 AV block improved with exercise, which is a reassuring sign.   Patient is here for 3 month follow up. He feels better, but still gets tired with activity. BP is elevated,. He is not taking Bidil as it was not approved by his insurance company.     Past Medical History:  Diagnosis Date  . Abnormal EKG   . Aortic regurgitation   . Coronary artery disease   . Diabetes mellitus   . DOE (dyspnea on exertion)   . Gout   . Hyperlipidemia   . Hypertension   . Mobitz I     Past Surgical History:  Procedure Laterality Date  . TUMOR REMOVAL     from his nose     Social History   Socioeconomic History  . Marital status: Married    Spouse name: Not on file  . Number of children: Not on file  . Years of education: Not on file  . Highest education level: Not on file  Occupational History  . Not on file  Social Needs  . Financial resource strain: Not on file  . Food insecurity:    Worry: Not on file    Inability: Not on file  . Transportation needs:    Medical: Not on file    Non-medical: Not on file  Tobacco Use  . Smoking status: Never Smoker  . Smokeless tobacco: Never Used  Substance and Sexual  Activity  . Alcohol use: Yes  . Drug use: Not on file  . Sexual activity: Not on file  Lifestyle  . Physical activity:    Days per week: Not on file    Minutes per session: Not on file  . Stress: Not on file  Relationships  . Social connections:    Talks on phone: Not on file    Gets together: Not on file    Attends religious service: Not on file    Active member of club or organization: Not on file    Attends meetings of clubs or organizations: Not on file    Relationship status: Not on file  . Intimate partner violence:    Fear of current or ex partner: Not on file    Emotionally abused: Not on file    Physically abused: Not on file    Forced sexual activity: Not on file  Other Topics Concern  . Not on file  Social History Narrative  . Not on file    Current Outpatient Medications on File Prior to Visit  Medication Sig Dispense Refill  . allopurinol (ZYLOPRIM) 300 MG tablet Take 300 mg by mouth daily. Reported on 02/25/2016    . amLODipine (NORVASC) 10 MG tablet Take 10  mg by mouth daily.    Marland Kitchen. atorvastatin (LIPITOR) 20 MG tablet Take 20 mg by mouth daily.    . bimatoprost (LUMIGAN) 0.03 % ophthalmic solution 1 drop at bedtime. Reported on 02/25/2016    . colchicine 0.6 MG tablet Take 0.6 mg by mouth daily.    . diclofenac sodium (VOLTAREN) 1 % GEL Apply 2 g topically 4 (four) times daily.    Marland Kitchen. gabapentin (NEURONTIN) 300 MG capsule Take 300 mg by mouth 3 (three) times daily.    Marland Kitchen. glimepiride (AMARYL) 1 MG tablet Take 4 mg by mouth daily before breakfast.     . insulin degludec (TRESIBA FLEXTOUCH) 100 UNIT/ML SOPN FlexTouch Pen Inject 45 Units into the skin daily.    Marland Kitchen. olmesartan-hydrochlorothiazide (BENICAR HCT) 40-25 MG tablet Take 1 tablet by mouth daily.    . sildenafil (REVATIO) 20 MG tablet Take 20 mg by mouth as needed.    Marland Kitchen. spironolactone (ALDACTONE) 25 MG tablet Take 25 mg by mouth daily.    Marland Kitchen. tiZANidine (ZANAFLEX) 4 MG capsule Take 4 mg by mouth 3 (three) times  daily.    . Vitamin D, Ergocalciferol, (DRISDOL) 1.25 MG (50000 UT) CAPS capsule Take 50,000 Units by mouth every 7 (seven) days.     No current facility-administered medications on file prior to visit.     Cardiovascular studies:  EKG 12/09/2018: Sinus rhythm. Mobitz 1 AV blockl. RBBB. LAFB. Left atrial enlargement No significant change compared to previous EKG's    Echocardiogram 07/01/2018: Left ventricle cavity is normal in size. Mild decrease in global wall motion. Visual EF is 45-50%. Diastolic function assessment limited due to Mobitz type 1 AV block and indequate Doppler evaluation.  Calculated EF 40%. Mild to moderate aortic regurgitation with anteriorly directed eccentric jet. Mild tricuspid regurgitation. Estimated pulmonary artery systolic pressure 22  mmHg.  Lexiscan/modified Bruce exercise Sestamibi stress test 06/01/2018:  1. Lexiscan stress test was performed. In addition, patient performed exercise on modified Bruce protocol, reaching 87% of his age predicted maximum heart rate. Low exercise capacity. Normal hemodynamic response. Peak effect blood pressure was 202/86 mmHg. Stress symptoms included chest tightness.  The resting electrocardiogram demonstrated normal sinus rhythm, Mobitz type 1 AV block, RBBB + LPHB,  and normal rest repolarization.  While stress EKG is non diagnostic for ischemia in a pharmacologic stress, patient did reach target heart rate on modified bruce protocol exercise. Stress electrocardiogram demonstrated normal sinus rhythm, 1:1 AV conduction, RBBB + LPHB,  and normal rest repolarization. No ischemic changes seen on stress electrocardiogram.  2. The overall quality of the study is fair.  Left ventricular cavity is noted to be normal on the rest and stress studies.  Gated SPECT images reveal mildly reduced myocardial thickening and wall motion.  The left ventricular ejection fraction was calculated or visually estimated to be 46%.  Small sized, mild  intensity perfusion defect in apical to basal inferior myocardium on stress SPECT images, with increase in size and severity on rest SPECT images. While perfusion defect could be secondary to subdiaphragmatic attenuation, small area of ischemia in this region cannot be excluded.  3. Intermediate risk study.   Review of Systems  Constitution: Positive for malaise/fatigue. Negative for decreased appetite, weight gain and weight loss.  HENT: Negative for congestion.   Eyes: Negative for visual disturbance.  Cardiovascular: Positive for dyspnea on exertion. Negative for chest pain, leg swelling, palpitations and syncope.  Respiratory: Positive for shortness of breath.   Endocrine: Negative for cold intolerance.  Hematologic/Lymphatic: Does not bruise/bleed easily.  Skin: Negative for itching and rash.  Musculoskeletal: Negative for myalgias.  Gastrointestinal: Negative for abdominal pain, nausea and vomiting.  Genitourinary: Negative for dysuria.  Neurological: Negative for dizziness and weakness.  Psychiatric/Behavioral: The patient is not nervous/anxious.   All other systems reviewed and are negative.      Objective:   Vitals:   12/09/18 1010  BP: (!) 149/70  Pulse: (!) 53  SpO2: 96%     Physical Exam  Constitutional: He is oriented to person, place, and time. He appears well-developed and well-nourished. No distress.  HENT:  Head: Normocephalic and atraumatic.  Eyes: Pupils are equal, round, and reactive to light. Conjunctivae are normal.  Neck: No JVD present.  Cardiovascular: Normal rate, regular rhythm and intact distal pulses.  Murmur (II/VI early diastolic murmur LLSB) heard. Pulmonary/Chest: Effort normal and breath sounds normal. He has no wheezes. He has no rales.  Abdominal: Soft. Bowel sounds are normal. There is no rebound.  Musculoskeletal:        General: Edema (trace b/l) present.  Lymphadenopathy:    He has no cervical adenopathy.  Neurological: He is alert  and oriented to person, place, and time. No cranial nerve deficit.  Skin: Skin is warm and dry.  Psychiatric: He has a normal mood and affect.  Nursing note and vitals reviewed.       Assessment & Recommendations:   63 year old African-American male with hypertension, uncontrolled type II diabetes mellitus, chronic kidney disease, hyperlipidemia, glaucoma, now with exertional dyspnea. Mild to moderate aortic regurgitation, mildly reduced LVEF, relatively low risk stress test with no significant ischemia.  Exertional dyspnea: Suspect primarily due to uncontrolled hypertension. He is not able to afford Bidil. Ideally, I would like to increase spironolactone dose, but limited due to his elevated Cr and potassium. Instead, I have started him on lasix 40 mg daily. Continue rest of the antihypertensive therapy. Obtain labs from PCP, and repeat BMP in 10 days after starting lasix.. Check echocardiogram in 3 weeks to follow up pn aortic regurgitation. See him after that.   Hypertension:  As above  Mobitz 1 AV block: I do not think this is the etiology for his symptoms, as AV conduction improved to 1:1 with exercise on treadmill. Also has RBBB, LAFB. Nonetheless, no indication for pacemaker at this time.   Type 2 DM: Uncontrolled. Follow up with Dr. Concepcion Elk  Hyperlipidemia: Contiinue lipitor  Follow up with me in 4 weeks.   Elder Negus, MD [facility] [place] Pager: [phone] Office: [phone] If no answer Cell [phone]

## 2018-12-22 LAB — BASIC METABOLIC PANEL
BUN / CREAT RATIO: 18 (ref 10–24)
BUN: 28 mg/dL — ABNORMAL HIGH (ref 8–27)
CO2: 20 mmol/L (ref 20–29)
Calcium: 9.6 mg/dL (ref 8.6–10.2)
Chloride: 105 mmol/L (ref 96–106)
Creatinine, Ser: 1.52 mg/dL — ABNORMAL HIGH (ref 0.76–1.27)
GFR calc Af Amer: 55 mL/min/{1.73_m2} — ABNORMAL LOW (ref >59)
GFR, EST NON AFRICAN AMERICAN: 48 mL/min/{1.73_m2} — AB (ref >59)
GLUCOSE: 129 mg/dL — AB (ref 65–99)
POTASSIUM: 4.5 mmol/L (ref 3.5–5.2)
Sodium: 140 mmol/L (ref 134–144)

## 2018-12-31 ENCOUNTER — Ambulatory Visit: Payer: BLUE CROSS/BLUE SHIELD

## 2018-12-31 DIAGNOSIS — I351 Nonrheumatic aortic (valve) insufficiency: Secondary | ICD-10-CM

## 2019-01-03 NOTE — Progress Notes (Signed)
Terrence Pena is here for follow up visit.  Subjective:   '@Terrence Pena'  ID: Terrence Pena., male    DOB: May 26, 1956, 63 y.o.   MRN: [id number]  Chief Complaint  Terrence Pena presents with  . Hypertension  . Follow-up    4 WK, echo, lab results, last EKG 12/09/18    HPI    63 year-old African-American male with hypertension, uncontrolled type II diabetes mellitus, chronic kidney disease, hyperlipidemia, glaucoma, Mobitz type 1 AV block, mild to moderate aortic regurgitation,  stress test with no significant ischemia/infarction (05/2018).  Recent echocardiogram showed no significant change in his mild to mod AI.   He is feeling much better, with improvement in his dyspnea, leg swelling, and blood pressure.   Past Medical History:  Diagnosis Date  . Abnormal EKG   . Aortic regurgitation   . Coronary artery disease   . Diabetes mellitus   . DOE (dyspnea on exertion)   . Gout   . Hyperlipidemia   . Hypertension   . Mobitz I     Past Surgical History:  Procedure Laterality Date  . TUMOR REMOVAL     from his nose     Social History   Socioeconomic History  . Marital status: Married    Spouse name: Not on file  . Number of children: Not on file  . Years of education: Not on file  . Highest education level: Not on file  Occupational History  . Not on file  Social Needs  . Financial resource strain: Not on file  . Food insecurity:    Worry: Not on file    Inability: Not on file  . Transportation needs:    Medical: Not on file    Non-medical: Not on file  Tobacco Use  . Smoking status: Never Smoker  . Smokeless tobacco: Never Used  Substance and Sexual Activity  . Alcohol use: Yes  . Drug use: Not on file  . Sexual activity: Not on file  Lifestyle  . Physical activity:    Days per week: Not on file    Minutes per session: Not on file  . Stress: Not on file  Relationships  . Social connections:    Talks on phone: Not on file    Gets together: Not on file   Attends religious service: Not on file    Active member of club or organization: Not on file    Attends meetings of clubs or organizations: Not on file    Relationship status: Not on file  . Intimate partner violence:    Fear of current or ex partner: Not on file    Emotionally abused: Not on file    Physically abused: Not on file    Forced sexual activity: Not on file  Other Topics Concern  . Not on file  Social History Narrative  . Not on file    Current Outpatient Medications on File Prior to Visit  Medication Sig Dispense Refill  . allopurinol (ZYLOPRIM) 300 MG tablet Take 300 mg by mouth daily. Reported on 02/25/2016    . amLODipine (NORVASC) 10 MG tablet Take 10 mg by mouth daily.    Marland Kitchen atorvastatin (LIPITOR) 20 MG tablet Take 20 mg by mouth daily.    . bimatoprost (LUMIGAN) 0.03 % ophthalmic solution 1 drop at bedtime. Reported on 02/25/2016    . colchicine 0.6 MG tablet Take 0.6 mg by mouth daily.    . diclofenac sodium (VOLTAREN) 1 % GEL Apply 2 g  topically 4 (four) times daily.    . furosemide (LASIX) 40 MG tablet Take 1 tablet (40 mg total) by mouth daily. 30 tablet 2  . gabapentin (NEURONTIN) 300 MG capsule Take 300 mg by mouth 3 (three) times daily.    Marland Kitchen glimepiride (AMARYL) 1 MG tablet Take 4 mg by mouth daily before breakfast.     . insulin degludec (TRESIBA FLEXTOUCH) 100 UNIT/ML SOPN FlexTouch Pen Inject 45 Units into the skin daily.    Marland Kitchen olmesartan-hydrochlorothiazide (BENICAR HCT) 40-25 MG tablet Take 1 tablet by mouth daily.    . sildenafil (REVATIO) 20 MG tablet Take 20 mg by mouth as needed.    Marland Kitchen spironolactone (ALDACTONE) 25 MG tablet Take 25 mg by mouth daily.    Marland Kitchen tiZANidine (ZANAFLEX) 4 MG capsule Take 4 mg by mouth 3 (three) times daily.    . Vitamin D, Ergocalciferol, (DRISDOL) 1.25 MG (50000 UT) CAPS capsule Take 50,000 Units by mouth every 7 (seven) days.     No current facility-administered medications on file prior to visit.     Cardiovascular  studies:  EKG 12/09/2018: Sinus rhythm. Mobitz 1 AV blockl. RBBB. LAFB. Left atrial enlargement No significant change compared to previous EKG's   Echocardiogram 12/31/2018 :  Left ventricle cavity is normal in size. Low normal decrease in global wall motion. Visual EF is 50-55%. Normal diastolic filling pattern. Left ventricle regional wall motion findings: No wall motion abnormalities. Trileaflet aortic valve with mild to moderate regurgitation. Mild tricuspid regurgitation. Mild pulmonary hypertension. Estimated pulmonary artery systolic pressure is 31 mm Hg with RA pressure estimated at 8 mm Hg. IVC is normal with blunted respiratory response. This may suggest elevated right heart pressure Compared to the study done on 07/01/2018, EF appears to have improved from 45-50% of the present 50-55% or greater.  Mild pulmonary hypertension is new.  Lexiscan/modified Bruce exercise Sestamibi stress test 06/01/2018:  1. Lexiscan stress test was performed. In addition, Terrence Pena performed exercise on modified Bruce protocol, reaching 87% of his age predicted maximum heart rate. Low exercise capacity. Normal hemodynamic response. Peak effect blood pressure was 202/86 mmHg. Stress symptoms included chest tightness.  The resting electrocardiogram demonstrated normal sinus rhythm, Mobitz type 1 AV block, RBBB + LPHB,  and normal rest repolarization.  While stress EKG is non diagnostic for ischemia in a pharmacologic stress, Terrence Pena did reach target heart rate on modified bruce protocol exercise. Stress electrocardiogram demonstrated normal sinus rhythm, 1:1 AV conduction, RBBB + LPHB,  and normal rest repolarization. No ischemic changes seen on stress electrocardiogram.  2. The overall quality of the study is fair.  Left ventricular cavity is noted to be normal on the rest and stress studies.  Gated SPECT images reveal mildly reduced myocardial thickening and wall motion.  The left ventricular ejection fraction  was calculated or visually estimated to be 46%.  Small sized, mild intensity perfusion defect in apical to basal inferior myocardium on stress SPECT images, with increase in size and severity on rest SPECT images. While perfusion defect could be secondary to subdiaphragmatic attenuation, small area of ischemia in this region cannot be excluded.  3. Intermediate risk study.  Recent labs: Results for WADDELL, ITEN (MRN [id number]) as of 01/08/2019 16:50  Ref. Range 12/21/2018 26:94  BASIC METABOLIC PANEL Unknown Rpt (A)  Sodium Latest Ref Range: 134 - 144 mmol/L 140  Potassium Latest Ref Range: 3.5 - 5.2 mmol/L 4.5  Chloride Latest Ref Range: 96 - 106 mmol/L 105  CO2  Latest Ref Range: 20 - 29 mmol/L 20  Glucose Latest Ref Range: 65 - 99 mg/dL 129 (H)  BUN Latest Ref Range: 8 - 27 mg/dL 28 (H)  Creatinine Latest Ref Range: 0.76 - 1.27 mg/dL 1.52 (H)  Calcium Latest Ref Range: 8.6 - 10.2 mg/dL 9.6  BUN/Creatinine Ratio Latest Ref Range: 10 - 24  18  GFR, Est Non African American Latest Ref Range: >59 mL/min/1.73 48 (L)  GFR, Est African American Latest Ref Range: >59 mL/min/1.73 55 (L)    Labs 09/10/2018: Glucose 157. BUN/Cr 15/1.54. eGFR 54. Na/K 143/4.7  Labs 07/20/2018: Glucose 158. BUN/Cr 29/1.61. eGFR 45/51. Na/K 138/4.5   Review of Systems  Constitution: Negative for decreased appetite, malaise/fatigue, weight gain and weight loss.  HENT: Negative for congestion.   Eyes: Negative for visual disturbance.  Cardiovascular: Negative for chest pain, dyspnea on exertion, leg swelling, palpitations and syncope.  Respiratory: Negative for shortness of breath.   Endocrine: Negative for cold intolerance.  Hematologic/Lymphatic: Does not bruise/bleed easily.  Skin: Negative for itching and rash.  Musculoskeletal: Negative for myalgias.  Gastrointestinal: Negative for abdominal pain, nausea and vomiting.  Genitourinary: Negative for dysuria.  Neurological: Negative for dizziness  and weakness.  Psychiatric/Behavioral: The Terrence Pena is not nervous/anxious.   All other systems reviewed and are negative.      Objective:   There were no vitals filed for this visit.   Physical Exam  Constitutional: He is oriented to person, place, and time. He appears well-developed and well-nourished. No distress.  HENT:  Head: Normocephalic and atraumatic.  Eyes: Pupils are equal, round, and reactive to light. Conjunctivae are normal.  Neck: No JVD present.  Cardiovascular: Normal rate, regular rhythm and intact distal pulses.  Murmur (II/VI early diastolic murmur LLSB) heard. Pulmonary/Chest: Effort normal and breath sounds normal. He has no wheezes. He has no rales.  Abdominal: Soft. Bowel sounds are normal. There is no rebound.  Musculoskeletal:        General: No edema (trace b/l).  Lymphadenopathy:    He has no cervical adenopathy.  Neurological: He is alert and oriented to person, place, and time. No cranial nerve deficit.  Skin: Skin is warm and dry.  Psychiatric: He has a normal mood and affect.  Nursing note and vitals reviewed.       Assessment & Recommendations:   63 year old African-American male with hypertension, uncontrolled type II diabetes mellitus, chronic kidney disease, hyperlipidemia, glaucoma, now with exertional dyspnea. Mild to moderate aortic regurgitation, mildly reduced LVEF, relatively low risk stress test with no significant ischemia.    Exertional dyspnea: Significant improvement with better blood pressure control. Tolerating spironolactone well. Mild to moderate AI appears unchanged from prior. Continue current medical therapy.   Hypertension:  As above  Mobitz 1 AV block: I do not think this is the etiology for his symptoms, as AV conduction improved to 1:1 with exercise on treadmill. Also has RBBB, LAFB. Nonetheless, no indication for pacemaker at this time.   Type 2 DM: Follow up with Dr. Jeanie Cooks  Hyperlipidemia: Contiinue  lipitor  Follow up with me in 6 months  Ryan Palermo Esther Hardy, MD [facility] Pager: [phone] Office: [phone] If no answer Cell [phone]

## 2019-01-08 ENCOUNTER — Other Ambulatory Visit: Payer: Self-pay

## 2019-01-08 ENCOUNTER — Encounter: Payer: Self-pay | Admitting: Cardiology

## 2019-01-08 ENCOUNTER — Ambulatory Visit: Payer: BLUE CROSS/BLUE SHIELD | Admitting: Cardiology

## 2019-01-08 VITALS — BP 120/71 | HR 55 | Ht 73.0 in | Wt 218.4 lb

## 2019-01-08 DIAGNOSIS — I10 Essential (primary) hypertension: Secondary | ICD-10-CM

## 2019-01-08 DIAGNOSIS — N183 Chronic kidney disease, stage 3 unspecified: Secondary | ICD-10-CM

## 2019-01-08 DIAGNOSIS — I129 Hypertensive chronic kidney disease with stage 1 through stage 4 chronic kidney disease, or unspecified chronic kidney disease: Secondary | ICD-10-CM | POA: Diagnosis not present

## 2019-01-08 DIAGNOSIS — I351 Nonrheumatic aortic (valve) insufficiency: Secondary | ICD-10-CM | POA: Insufficient documentation

## 2019-01-11 ENCOUNTER — Encounter: Payer: Self-pay | Admitting: Cardiology

## 2019-01-11 DIAGNOSIS — Z0189 Encounter for other specified special examinations: Secondary | ICD-10-CM | POA: Insufficient documentation

## 2019-03-02 ENCOUNTER — Other Ambulatory Visit: Payer: Self-pay | Admitting: Cardiology

## 2019-03-02 DIAGNOSIS — R600 Localized edema: Secondary | ICD-10-CM

## 2019-03-02 NOTE — Telephone Encounter (Signed)
Please fill

## 2019-05-26 ENCOUNTER — Other Ambulatory Visit: Payer: Self-pay | Admitting: Cardiology

## 2019-05-26 DIAGNOSIS — R600 Localized edema: Secondary | ICD-10-CM

## 2019-07-14 ENCOUNTER — Ambulatory Visit: Payer: BLUE CROSS/BLUE SHIELD | Admitting: Cardiology

## 2019-08-02 ENCOUNTER — Encounter: Payer: Self-pay | Admitting: Cardiology

## 2019-08-02 ENCOUNTER — Other Ambulatory Visit: Payer: Self-pay

## 2019-08-02 ENCOUNTER — Ambulatory Visit (INDEPENDENT_AMBULATORY_CARE_PROVIDER_SITE_OTHER): Payer: Medicare Other | Admitting: Cardiology

## 2019-08-02 VITALS — BP 149/72 | HR 44 | Temp 97.1°F | Ht 73.0 in | Wt 215.0 lb

## 2019-08-02 DIAGNOSIS — I10 Essential (primary) hypertension: Secondary | ICD-10-CM

## 2019-08-02 DIAGNOSIS — R600 Localized edema: Secondary | ICD-10-CM

## 2019-08-02 DIAGNOSIS — I351 Nonrheumatic aortic (valve) insufficiency: Secondary | ICD-10-CM | POA: Diagnosis not present

## 2019-08-02 MED ORDER — FUROSEMIDE 40 MG PO TABS: 40.0000 mg | ORAL_TABLET | Freq: Every day | ORAL | 2 refills | Status: AC

## 2019-08-02 MED ORDER — SPIRONOLACTONE 25 MG PO TABS: 25.0000 mg | ORAL_TABLET | Freq: Every day | ORAL | 2 refills | Status: AC

## 2019-08-02 MED ORDER — AMLODIPINE BESYLATE 10 MG PO TABS
10.0000 mg | ORAL_TABLET | Freq: Every day | ORAL | 2 refills | Status: DC
Start: 2019-08-02 — End: 2020-07-06

## 2019-08-02 MED ORDER — OLMESARTAN MEDOXOMIL-HCTZ 40-25 MG PO TABS: 1.0000 | ORAL_TABLET | Freq: Every day | ORAL | 2 refills | Status: AC

## 2019-08-02 NOTE — Progress Notes (Signed)
Patient is here for follow up visit.  Subjective:   _0  ID: Terrence Dike., male    DOB: July 27, 1956, 63 y.o.   MRN: [id number]   Chief Complaint  Patient presents with  . Hypertension  . Follow-up    6 month    HPI   63 year old African-American male with hypertension, uncontrolled type II diabetes mellitus, chronic kidney disease, hyperlipidemia, glaucoma, now with exertional dyspnea. Mild to moderate aortic regurgitation, mildly reduced LVEF, relatively low risk stress test with no significant ischemia (2019)  Echocardiogram in 12/2018 showed no significant change in his mild to mod AI. Overall, he is feeling well, with improvement in his dyspnea, leg swelling, and blood pressure. He is going to establish care with a new PCP tomorrow-Dr. Corena Pilgrim with [facility].   Past Medical History:  Diagnosis Date  . Abnormal EKG   . Aortic regurgitation   . Coronary artery disease   . Diabetes mellitus   . DOE (dyspnea on exertion)   . Gout   . Hyperlipidemia   . Hypertension   . Mobitz I     Past Surgical History:  Procedure Laterality Date  . TUMOR REMOVAL     from his nose     Social History   Socioeconomic History  . Marital status: Divorced    Spouse name: Not on file  . Number of children: 3  . Years of education: Not on file  . Highest education level: Not on file  Occupational History  . Not on file  Social Needs  . Financial resource strain: Not on file  . Food insecurity    Worry: Not on file    Inability: Not on file  . Transportation needs    Medical: Not on file    Non-medical: Not on file  Tobacco Use  . Smoking status: Never Smoker  . Smokeless tobacco: Never Used  Substance and Sexual Activity  . Alcohol use: Not Currently    Comment: o  . Drug use: Never  . Sexual activity: Not on file  Lifestyle  . Physical activity    Days per week: Not on file    Minutes per session: Not on file  . Stress: Not on file   Relationships  . Social Herbalist on phone: Not on file    Gets together: Not on file    Attends religious service: Not on file    Active member of club or organization: Not on file    Attends meetings of clubs or organizations: Not on file    Relationship status: Not on file  . Intimate partner violence    Fear of current or ex partner: Not on file    Emotionally abused: Not on file    Physically abused: Not on file    Forced sexual activity: Not on file  Other Topics Concern  . Not on file  Social History Narrative  . Not on file    Current Outpatient Medications on File Prior to Visit  Medication Sig Dispense Refill  . allopurinol (ZYLOPRIM) 300 MG tablet Take 300 mg by mouth daily. Reported on 02/25/2016    . amLODipine (NORVASC) 10 MG tablet Take 10 mg by mouth daily.    Marland Kitchen atorvastatin (LIPITOR) 20 MG tablet Take 20 mg by mouth daily.    . bimatoprost (LUMIGAN) 0.03 % ophthalmic solution 1 drop at bedtime. Reported on 02/25/2016    . colchicine 0.6 MG tablet Take 0.6  mg by mouth daily.    . diclofenac sodium (VOLTAREN) 1 % GEL Apply 2 g topically 4 (four) times daily.    . furosemide (LASIX) 40 MG tablet TAKE 1 TABLET BY MOUTH EVERY DAY 90 tablet 0  . gabapentin (NEURONTIN) 300 MG capsule Take 300 mg by mouth 3 (three) times daily.    Marland Kitchen glimepiride (AMARYL) 1 MG tablet Take 4 mg by mouth 2 (two) times daily.     . insulin degludec (TRESIBA FLEXTOUCH) 100 UNIT/ML SOPN FlexTouch Pen Inject 45 Units into the skin daily.    Marland Kitchen olmesartan-hydrochlorothiazide (BENICAR HCT) 40-25 MG tablet Take 1 tablet by mouth daily.    . sildenafil (REVATIO) 20 MG tablet Take 20 mg by mouth as needed.    Marland Kitchen spironolactone (ALDACTONE) 25 MG tablet Take 25 mg by mouth daily.    Marland Kitchen tiZANidine (ZANAFLEX) 4 MG capsule Take 4 mg by mouth 3 (three) times daily.    . Vitamin D, Ergocalciferol, (DRISDOL) 1.25 MG (50000 UT) CAPS capsule Take 50,000 Units by mouth every 7 (seven) days.     No  current facility-administered medications on file prior to visit.     Cardiovascular studies:  EKG 08/02/2019: Sinus rhythm 60 bpm. Second degree type 1 AV block. Right bundle branch block. Left anterior fascicular block.  Left atrial enlargement.   EKG 12/09/2018: Sinus rhythm. Mobitz 1 AV blockl. RBBB. LAFB. Left atrial enlargement No significant change compared to previous EKG's   Echocardiogram 12/31/2018 :  Left ventricle cavity is normal in size. Low normal decrease in global wall motion. Visual EF is 50-55%. Normal diastolic filling pattern. Left ventricle regional wall motion findings: No wall motion abnormalities. Trileaflet aortic valve with mild to moderate regurgitation. Mild tricuspid regurgitation. Mild pulmonary hypertension. Estimated pulmonary artery systolic pressure is 31 mm Hg with RA pressure estimated at 8 mm Hg. IVC is normal with blunted respiratory response. This may suggest elevated right heart pressure Compared to the study done on 07/01/2018, EF appears to have improved from 45-50% of the present 50-55% or greater.  Mild pulmonary hypertension is new.  Lexiscan/modified Bruce exercise Sestamibi stress test 06/01/2018:  1. Lexiscan stress test was performed. In addition, patient performed exercise on modified Bruce protocol, reaching 87% of his age predicted maximum heart rate. Low exercise capacity. Normal hemodynamic response. Peak effect blood pressure was 202/86 mmHg. Stress symptoms included chest tightness.  The resting electrocardiogram demonstrated normal sinus rhythm, Mobitz type 1 AV block, RBBB + LPHB,  and normal rest repolarization.  While stress EKG is non diagnostic for ischemia in a pharmacologic stress, patient did reach target heart rate on modified bruce protocol exercise. Stress electrocardiogram demonstrated normal sinus rhythm, 1:1 AV conduction, RBBB + LPHB,  and normal rest repolarization. No ischemic changes seen on stress electrocardiogram.   2. The overall quality of the study is fair.  Left ventricular cavity is noted to be normal on the rest and stress studies.  Gated SPECT images reveal mildly reduced myocardial thickening and wall motion.  The left ventricular ejection fraction was calculated or visually estimated to be 46%.  Small sized, mild intensity perfusion defect in apical to basal inferior myocardium on stress SPECT images, with increase in size and severity on rest SPECT images. While perfusion defect could be secondary to subdiaphragmatic attenuation, small area of ischemia in this region cannot be excluded.  3. Intermediate risk study.  Recent labs: Results for ABBIE, JABLON (MRN [id number]) as of 01/08/2019 16:50  Ref.  Range 12/21/2018 62:83  BASIC METABOLIC PANEL Unknown Rpt (A)  Sodium Latest Ref Range: 134 - 144 mmol/L 140  Potassium Latest Ref Range: 3.5 - 5.2 mmol/L 4.5  Chloride Latest Ref Range: 96 - 106 mmol/L 105  CO2 Latest Ref Range: 20 - 29 mmol/L 20  Glucose Latest Ref Range: 65 - 99 mg/dL 129 (H)  BUN Latest Ref Range: 8 - 27 mg/dL 28 (H)  Creatinine Latest Ref Range: 0.76 - 1.27 mg/dL 1.52 (H)  Calcium Latest Ref Range: 8.6 - 10.2 mg/dL 9.6  BUN/Creatinine Ratio Latest Ref Range: 10 - 24  18  GFR, Est Non African American Latest Ref Range: >59 mL/min/1.73 48 (L)  GFR, Est African American Latest Ref Range: >59 mL/min/1.73 55 (L)    Labs 09/10/2018: Glucose 157. BUN/Cr 15/1.54. eGFR 54. Na/K 143/4.7  Labs 07/20/2018: Glucose 158. BUN/Cr 29/1.61. eGFR 45/51. Na/K 138/4.5   Review of Systems  Constitution: Negative for decreased appetite, malaise/fatigue, weight gain and weight loss.  HENT: Negative for congestion.   Eyes: Negative for visual disturbance.  Cardiovascular: Negative for chest pain, dyspnea on exertion, leg swelling, palpitations and syncope.  Respiratory: Negative for shortness of breath.   Endocrine: Negative for cold intolerance.  Hematologic/Lymphatic: Does not  bruise/bleed easily.  Skin: Negative for itching and rash.  Musculoskeletal: Negative for myalgias.  Gastrointestinal: Negative for abdominal pain, nausea and vomiting.  Genitourinary: Negative for dysuria.  Neurological: Negative for dizziness and weakness.  Psychiatric/Behavioral: The patient is not nervous/anxious.   All other systems reviewed and are negative.      Objective:    Vitals:   08/02/19 1422  BP: (!) 149/72  Pulse: (!) 44  Temp: (!) 97.1 F (36.2 C)  SpO2: 96%     Physical Exam  Constitutional: He is oriented to person, place, and time. He appears well-developed and well-nourished. No distress.  HENT:  Head: Normocephalic and atraumatic.  Eyes: Pupils are equal, round, and reactive to light. Conjunctivae are normal.  Neck: No JVD present.  Cardiovascular: Normal rate, regular rhythm and intact distal pulses.  Murmur (II/VI early diastolic murmur LLSB) heard. Pulmonary/Chest: Effort normal and breath sounds normal. He has no wheezes. He has no rales.  Abdominal: Soft. Bowel sounds are normal. There is no rebound.  Musculoskeletal:        General: No edema (trace b/l).  Lymphadenopathy:    He has no cervical adenopathy.  Neurological: He is alert and oriented to person, place, and time. No cranial nerve deficit.  Skin: Skin is warm and dry.  Psychiatric: He has a normal mood and affect.  Nursing note and vitals reviewed.       Assessment & Recommendations:   63 year old African-American male with hypertension, uncontrolled type II diabetes mellitus, chronic kidney disease, hyperlipidemia, glaucoma, now with exertional dyspnea. Mild to moderate aortic regurgitation, mildly reduced LVEF, relatively low risk stress test with no significant ischemia (2019)   Exertional dyspnea: Significant improvement with better blood pressure control. Tolerating spironolactone well. Mild to moderate AI appears unchanged from prior. Continue current medical therapy.    Hypertension:  As above  AV conduction disease: This has transitioned between Mobitz type 1 AV block and 2:1 AV block. He is asymptomatic. He did not have any deterioration on treadmill stress test (05/2018). Avoid AV nodal blocking agents. No indication for pacemaker at this time.   Type 2 DM: Follow up with PCP  Hyperlipidemia: Contiinue lipitor  Repeat BMP and echocardiogram in 6 months, followed by office  visit.   Terrence Mormon, MD [facility] Pager: [phone] Office: [phone] If no answer Cell [phone]

## 2019-11-29 ENCOUNTER — Other Ambulatory Visit: Payer: Self-pay

## 2019-11-29 ENCOUNTER — Ambulatory Visit (INDEPENDENT_AMBULATORY_CARE_PROVIDER_SITE_OTHER): Payer: Medicare Other

## 2019-11-29 DIAGNOSIS — I351 Nonrheumatic aortic (valve) insufficiency: Secondary | ICD-10-CM

## 2019-11-30 LAB — BASIC METABOLIC PANEL
BUN/Creatinine Ratio: 16 (ref 10–24)
BUN: 27 mg/dL (ref 8–27)
CO2: 13 mmol/L — ABNORMAL LOW (ref 20–29)
Calcium: 9.4 mg/dL (ref 8.6–10.2)
Chloride: 98 mmol/L (ref 96–106)
Creatinine, Ser: 1.65 mg/dL — ABNORMAL HIGH (ref 0.76–1.27)
GFR calc Af Amer: 50 mL/min/{1.73_m2} — ABNORMAL LOW (ref >59)
GFR calc non Af Amer: 43 mL/min/{1.73_m2} — ABNORMAL LOW (ref >59)
Glucose: 361 mg/dL — ABNORMAL HIGH (ref 65–99)
Potassium: 6.7 mmol/L (ref 3.5–5.2)
Sodium: 132 mmol/L — ABNORMAL LOW (ref 134–144)

## 2019-11-30 NOTE — Progress Notes (Signed)
Please ask him NOT to take spironolactone and olmesartan-HCTZ. Please let his PCP office know about this.  Thanks MJP

## 2019-11-30 NOTE — Progress Notes (Signed)
Called pt PCP Dr.John Eulah Pont spoke with one of his CMA and explain pt lab results and to not take Spiromolacton and Olmesartan/HCTZ any more. CMA mention she was going to send in a message to the doctor.

## 2019-11-30 NOTE — Progress Notes (Signed)
Called pt to inform him about the message above. Pt mention he will not go to the ED due that he does not want to wait for hours. Pt mention he made an appt with his PCP for today at 12:15. Explain to pt that it is important for him to go to the ED. Pt still said no he will not.

## 2020-01-10 ENCOUNTER — Ambulatory Visit: Payer: Medicare Other | Admitting: Cardiology

## 2020-07-06 ENCOUNTER — Other Ambulatory Visit: Payer: Self-pay | Admitting: Cardiology

## 2020-07-06 DIAGNOSIS — I10 Essential (primary) hypertension: Secondary | ICD-10-CM

## 2020-09-29 ENCOUNTER — Other Ambulatory Visit: Payer: Self-pay | Admitting: Cardiology

## 2020-09-29 DIAGNOSIS — I10 Essential (primary) hypertension: Secondary | ICD-10-CM

## 2020-12-24 ENCOUNTER — Other Ambulatory Visit: Payer: Self-pay | Admitting: Cardiology

## 2020-12-24 DIAGNOSIS — I10 Essential (primary) hypertension: Secondary | ICD-10-CM
